# Patient Record
Sex: Male | Born: 1990 | Race: Black or African American | Hispanic: No | Marital: Single | Smoking: Current some day smoker
Health system: Southern US, Community
[De-identification: ages and names within clinical notes are randomized; demographics above are authoritative.]

## PROBLEM LIST (undated history)

## (undated) DIAGNOSIS — B009 Herpesviral infection, unspecified: Secondary | ICD-10-CM

## (undated) HISTORY — PX: KNEE SURGERY: SHX244

---
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
## 2014-05-01 ENCOUNTER — Emergency Department (INDEPENDENT_AMBULATORY_CARE_PROVIDER_SITE_OTHER)
Admission: EM | Admit: 2014-05-01 | Discharge: 2014-05-01 | Disposition: A | Discharge status: Home / Self Care | Payer: Self-pay | Source: Home / Self Care | Attending: Emergency Medicine | Admitting: Emergency Medicine

## 2014-05-01 ENCOUNTER — Encounter (HOSPITAL_COMMUNITY): Payer: Self-pay | Admitting: Emergency Medicine

## 2014-05-01 DIAGNOSIS — H9203 Otalgia, bilateral: Secondary | ICD-10-CM

## 2014-05-01 MED ORDER — FLUTICASONE PROPIONATE 50 MCG/ACT NA SUSP: 1.0000 | Freq: Every day | NASAL | Status: AC

## 2014-05-01 MED ORDER — IPRATROPIUM BROMIDE 0.06 % NA SOLN: 2.0000 | Freq: Four times a day (QID) | NASAL | Status: AC

## 2014-05-01 MED ORDER — CETIRIZINE HCL 10 MG PO TABS: 10.0000 mg | ORAL_TABLET | Freq: Every day | ORAL | Status: AC

## 2014-05-01 NOTE — Discharge Instructions (Signed)
Your ear pain is likely from sinus congestion. Take Zyrtec 1 pill daily. Use the Flonase once daily. Use Atrovent nasal spray 4 times a day. You should see improvement in the next week. If your ears are still bothering you in 1 week, follow-up with the ENT physician.

## 2014-05-01 NOTE — ED Notes (Signed)
Pt states that he has had ear pain and stuffy ears for 2 weeks.

## 2014-05-01 NOTE — ED Provider Notes (Signed)
CSN: [id number]     Arrival date & time 05/01/14  1834 History   First MD Initiated Contact with Patient 05/01/14 1937     Chief Complaint  Patient presents with  . Otalgia   (Consider location/radiation/quality/duration/timing/severity/associated sxs/prior Treatment) HPI  He is a 24 year old man here for evaluation of bilateral ear pain. He states this is been going on for 2 weeks. It is in the setting of a cold with nasal congestion. He denies any drainage from his ears. No fevers or chills. He does report feeling that his ears are stuffy, and decreased hearing.  History reviewed. No pertinent past medical history. History reviewed. No pertinent past surgical history. Family History  Problem Relation Age of Onset  . Hypertension Father   . Diabetes Father    History  Substance Use Topics  . Smoking status: Never Smoker   . Smokeless tobacco: Not on file  . Alcohol Use: No    Review of Systems  Constitutional: Negative for fever.  HENT: Positive for congestion, ear pain and hearing loss. Negative for ear discharge.     Allergies  Cherry  Home Medications   Prior to Admission medications   Medication Sig Start Date End Date Taking? Authorizing Provider  cetirizine (ZYRTEC) 10 MG tablet Take 1 tablet (10 mg total) by mouth daily. 05/01/14   Charm RingsErin J Asiya Cutbirth, MD  fluticasone (FLONASE) 50 MCG/ACT nasal spray Place 1 spray into both nostrils daily. 05/01/14   Charm RingsErin J Sheletha Bow, MD  ipratropium (ATROVENT) 0.06 % nasal spray Place 2 sprays into both nostrils 4 (four) times daily. 05/01/14   Charm RingsErin J Krystian Younglove, MD   BP 140/95 mmHg  Pulse 72  Temp(Src) 97 F (36.1 C) (Oral)  Resp 16  SpO2 98% Physical Exam  Constitutional: He is oriented to person, place, and time. He appears well-developed and well-nourished. No distress.  HENT:  Head: Atraumatic.  Right Ear: Tympanic membrane and external ear normal.  Left Ear: Tympanic membrane is retracted.  Cardiovascular: Normal rate.   Pulmonary/Chest:  Effort normal.  Neurological: He is alert and oriented to person, place, and time.    ED Course  Procedures (including critical care time) Labs Review Labs Reviewed - No data to display  Imaging Review No results found.   MDM   1. Ear pain, bilateral    This is likely secondary to nasal congestion and retracted ears. We'll treat with Zyrtec, Flonase, Atrovent nasal spray. If no improvement in 1 week, he should follow-up with ENT.    Charm RingsErin J Karalynn Cottone, MD 05/01/14 2016

## 2014-11-19 ENCOUNTER — Emergency Department (HOSPITAL_COMMUNITY)
Admission: EM | Admit: 2014-11-19 | Discharge: 2014-11-20 | Disposition: A | Discharge status: Home / Self Care | Payer: Medicaid Other | Attending: Emergency Medicine | Admitting: Emergency Medicine

## 2014-11-19 ENCOUNTER — Encounter (HOSPITAL_COMMUNITY): Payer: Self-pay | Admitting: Emergency Medicine

## 2014-11-19 ENCOUNTER — Emergency Department (HOSPITAL_COMMUNITY): Payer: Medicaid Other

## 2014-11-19 DIAGNOSIS — R059 Cough, unspecified: Secondary | ICD-10-CM

## 2014-11-19 DIAGNOSIS — R05 Cough: Secondary | ICD-10-CM

## 2014-11-19 DIAGNOSIS — Z79899 Other long term (current) drug therapy: Secondary | ICD-10-CM | POA: Insufficient documentation

## 2014-11-19 DIAGNOSIS — J020 Streptococcal pharyngitis: Secondary | ICD-10-CM | POA: Insufficient documentation

## 2014-11-19 LAB — RAPID STREP SCREEN (MED CTR MEBANE ONLY): STREPTOCOCCUS, GROUP A SCREEN (DIRECT): POSITIVE — AB

## 2014-11-19 NOTE — ED Notes (Signed)
Pt states he's been having problems with a sore throat for the past two weeks. States he's been managing symptoms with Dayquil/Nyquil. States today he noticed some blood in his sputum. Bloody tonsils/uvula upon inspection in triage. Pt states it's painful to swallow, and he feels as though his breathing has become more shallow over the past day. No obvious distress in triage, O2 sat 98% on RA

## 2014-11-19 NOTE — ED Provider Notes (Signed)
CSN: [id number]     Arrival date & time 11/19/14  2255 History   First MD Initiated Contact with Patient 11/19/14 2306     Chief Complaint  Patient presents with  . Sore Throat  . Dysphagia     (Consider location/radiation/quality/duration/timing/severity/associated sxs/prior Treatment) HPI Comments: Patient presents today with complaints of sore throat and cough.  He states that he had a sore throat 2 weeks ago, which improved after a couple of days.  Sore throat returned two days ago.  He states that the pain is gradually worsening.  He has also reports that he has had a productive cough for the past couple of weeks.  He states that today he noticed small specks of blood when he coughed.  He has been taking Nyquil and Dayquil for his symptoms without relief.  He reports recent exposure to Strep throat.  He denies difficulty swallowing, but states increased pain with swallowing.  He denies fever, chills, SOB, chest pain, nausea, or vomiting.  No history of PE and DVT.  The history is provided by the patient.    History reviewed. No pertinent past medical history. Past Surgical History  Procedure Laterality Date  . Knee surgery Bilateral    Family History  Problem Relation Age of Onset  . Hypertension Father   . Diabetes Father    Social History  Substance Use Topics  . Smoking status: Never Smoker   . Smokeless tobacco: None  . Alcohol Use: No    Review of Systems  All other systems reviewed and are negative.     Allergies  Cherry  Home Medications   Prior to Admission medications   Medication Sig Start Date End Date Taking? Authorizing Provider  Pseudoeph-Doxylamine-DM-APAP (NYQUIL MULTI-SYMPTOM PO) Take 1 capsule by mouth 2 (two) times daily.   Yes Historical Provider, MD  cetirizine (ZYRTEC) 10 MG tablet Take 1 tablet (10 mg total) by mouth daily. Patient not taking: Reported on 11/19/2014 05/01/14   Charm Rings, MD  fluticasone [facility]) 50 MCG/ACT nasal spray  Place 1 spray into both nostrils daily. Patient not taking: Reported on 11/19/2014 05/01/14   Charm Rings, MD  ipratropium (ATROVENT) 0.06 % nasal spray Place 2 sprays into both nostrils 4 (four) times daily. Patient not taking: Reported on 11/19/2014 05/01/14   Charm Rings, MD   BP 146/85 mmHg  Pulse 90  Temp(Src) 98.2 F (36.8 C) (Oral)  Resp 18  Ht  (1.727 m)  Wt 280 lb (127.007 kg)  BMI 42.58 kg/m2  SpO2 99% Physical Exam  Constitutional: He appears well-developed and well-nourished.  HENT:  Head: Normocephalic and atraumatic.  Mouth/Throat: Uvula is midline. No trismus in the jaw. No uvula swelling. Posterior oropharyngeal edema and posterior oropharyngeal erythema present. No oropharyngeal exudate or tonsillar abscesses.  Normal voice phonation Patient handling secretions well Small amount of blood visualized on the right tonsil  Neck: Normal range of motion. Neck supple.  Cardiovascular: Normal rate, regular rhythm and normal heart sounds.   Pulmonary/Chest: Effort normal and breath sounds normal.  Neurological: He is alert.  Skin: Skin is warm and dry.  Psychiatric: He has a normal mood and affect.  Nursing note and vitals reviewed.   ED Course  Procedures (including critical care time) Labs Review Labs Reviewed  RAPID STREP SCREEN (NOT AT [facility])    Imaging Review Dg Chest 2 View  11/20/2014   CLINICAL DATA:  Cough for 2 days.  Hemoptysis today.  Sore throat.  EXAM: CHEST  2 VIEW  COMPARISON:  03/16/2011  FINDINGS: The cardiomediastinal contours are normal. The lungs are clear. Pulmonary vasculature is normal. No consolidation, pleural effusion, or pneumothorax. Mild elevation of right hemidiaphragm. No acute osseous abnormalities are seen.  IMPRESSION: No acute pulmonary process.   Electronically Signed   By: Rubye Oaks M.D.   On: 11/20/2014 00:15   I have personally reviewed and evaluated these images and lab results as part of my medical decision-making.    EKG Interpretation None      MDM   Final diagnoses:  Cough   Patient presents today with cough and ST.  CXR is negative.  Rapid strep is positive.  Patient given Rx for Bicillin IM.  No signs of Epiglottitis, PTA, or Retropharyngeal Abscess at this time.  Patient is stable for discharge.  Return precautions given.      Santiago Glad, PA-C 11/21/14 2220  Derwood Kaplan, MD 11/23/14 [phone]

## 2014-11-20 MED ORDER — LIDOCAINE VISCOUS 2 % MT SOLN: 20.0000 mL | OROMUCOSAL | Status: AC | PRN

## 2014-11-20 MED ORDER — IBUPROFEN 800 MG PO TABS: 800.0000 mg | ORAL_TABLET | Freq: Three times a day (TID) | ORAL | Status: DC

## 2014-11-20 MED ORDER — PENICILLIN G BENZATHINE 1200000 UNIT/2ML IM SUSP
1.2000 10*6.[IU] | Freq: Once | INTRAMUSCULAR | Status: AC
  Administered 2014-11-20: 1.2 10*6.[IU] via INTRAMUSCULAR
  Filled 2014-11-20: qty 2

## 2015-07-20 ENCOUNTER — Encounter (HOSPITAL_COMMUNITY): Payer: Self-pay | Admitting: Emergency Medicine

## 2015-07-20 ENCOUNTER — Emergency Department (HOSPITAL_COMMUNITY)
Admission: EM | Admit: 2015-07-20 | Discharge: 2015-07-20 | Disposition: A | Discharge status: Home / Self Care | Payer: Medicaid Other | Attending: Emergency Medicine | Admitting: Emergency Medicine

## 2015-07-20 DIAGNOSIS — J45909 Unspecified asthma, uncomplicated: Secondary | ICD-10-CM | POA: Insufficient documentation

## 2015-07-20 DIAGNOSIS — F17200 Nicotine dependence, unspecified, uncomplicated: Secondary | ICD-10-CM | POA: Insufficient documentation

## 2015-07-20 DIAGNOSIS — N481 Balanitis: Secondary | ICD-10-CM | POA: Insufficient documentation

## 2015-07-20 DIAGNOSIS — I10 Essential (primary) hypertension: Secondary | ICD-10-CM | POA: Insufficient documentation

## 2015-07-20 DIAGNOSIS — E669 Obesity, unspecified: Secondary | ICD-10-CM | POA: Insufficient documentation

## 2015-07-20 DIAGNOSIS — E119 Type 2 diabetes mellitus without complications: Secondary | ICD-10-CM | POA: Insufficient documentation

## 2015-07-20 LAB — CBG MONITORING, ED: Glucose-Capillary: 111 mg/dL — ABNORMAL HIGH (ref 65–99)

## 2015-07-20 NOTE — ED Notes (Signed)
Pt states he was not circumcised and is having irritation to his penis  Pt states there is a lot of moisture and when he washes skin comes off on the washcloth and there is redness where the skin has come off   Pt states it is very sensitive to touch

## 2015-07-20 NOTE — ED Provider Notes (Signed)
CSN: [id number]     Arrival date & time 07/20/15  [id number] History   First MD Initiated Contact with Patient 07/20/15 [phone]     Chief Complaint  Patient presents with  . irritation to penis      (Consider location/radiation/quality/duration/timing/severity/associated sxs/prior Treatment) The history is provided by the patient.  25 year old male with a history of hypertension, diabetes, asthma comes in with irritation of the glans penis for the last several days. He relates that he is uncircumcised and some moisture tends to get between the pubis and the glands. He states that it is very irritated but he denies actual pain. He also states that it tends to itch. He has tried to treated by just manually drying it. He does express some interest in circumcision.  History reviewed. No pertinent past medical history. Past Surgical History  Procedure Laterality Date  . Knee surgery Bilateral    Family History  Problem Relation Age of Onset  . Hypertension Father   . Diabetes Father    Social History  Substance Use Topics  . Smoking status: Current Some Day Smoker  . Smokeless tobacco: None  . Alcohol Use: No    Review of Systems  All other systems reviewed and are negative.     Allergies  Cherry  Home Medications   Prior to Admission medications   Medication Sig Start Date End Date Taking? Authorizing Provider  cetirizine (ZYRTEC) 10 MG tablet Take 1 tablet (10 mg total) by mouth daily. Patient not taking: Reported on 11/19/2014 05/01/14   Charm RingsErin J Honig, MD  fluticasone [facility](FLONASE) 50 MCG/ACT nasal spray Place 1 spray into both nostrils daily. Patient not taking: Reported on 11/19/2014 05/01/14   Charm RingsErin J Honig, MD  ipratropium (ATROVENT) 0.06 % nasal spray Place 2 sprays into both nostrils 4 (four) times daily. Patient not taking: Reported on 11/19/2014 05/01/14   Charm RingsErin J Honig, MD  lidocaine (XYLOCAINE) 2 % solution Use as directed 20 mLs in the mouth or throat as needed for mouth  pain. Patient not taking: Reported on 07/20/2015 11/20/14   Santiago GladHeather Laisure, PA-C   BP 143/88 mmHg  Pulse 75  Temp(Src) 98 F (36.7 C) (Oral)  Resp 18  Ht 5\' 8"  (1.727 m)  Wt 284 lb (128.822 kg)  BMI 43.19 kg/m2  SpO2 98% Physical Exam  Nursing note and vitals reviewed.  Obese 25 year old male, resting comfortably and in no acute distress. Vital signs are significant for hypertension. Oxygen saturation is 98%, which is normal. Head is normocephalic and atraumatic. PERRLA, EOMI. Oropharynx is clear. Neck is nontender and supple without adenopathy or JVD. Back is nontender and there is no CVA tenderness. Lungs are clear without rales, wheezes, or rhonchi. Chest is nontender. Heart has regular rate and rhythm without murmur. Abdomen is soft, flat, nontender without masses or hepatosplenomegaly and peristalsis is normoactive. Genitalia: Uncircumcised penis. Mild erythema of the glans penis with some shallow ulcerations. Testes descended without masses. No inguinal adenopathy. Extremities have no cyanosis or edema, full range of motion is present. Skin is warm and dry without rash. Neurologic: Mental status is normal, cranial nerves are intact, there are no motor or sensory deficits.  ED Course  Procedures (including critical care time) Labs Review Results for orders placed or performed during the hospital encounter of 07/20/15  POC CBG, ED  Result Value Ref Range   Glucose-Capillary 111 (H) 65 - 99 mg/dL   I have personally reviewed and evaluated these lab results as part  of my medical decision-making.   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 He relates a history of type 2 diabetes but is not on any medication. Will check CBG. Poorly controlled diabetes would put him at increased risk for monilial infections. Old records are reviewed and he has no relevant past visits.  Blood sugar has come back at 111, so he is not started on  hypoglycemic medication.  Dione Booze, MD 07/20/15 [phone]

## 2015-07-20 NOTE — ED Notes (Signed)
MD at bedside. 

## 2015-07-20 NOTE — Discharge Instructions (Signed)
Apply an antifungal cream like lotrimin or monistat - apply twice a day. Store brands are as effective as name brands.   Balanitis Balanitis is inflammation of the head of the penis (glans).  CAUSES  Balanitis has multiple causes, both infectious and noninfectious. Often balanitis is the result of poor personal hygiene, especially in uncircumcised males. Without adequate washing, viruses, bacteria, and yeast collect between the foreskin and the glans. This can cause an infection. Lack of air and irritation from a normal secretion called smegma contribute to the cause in uncircumcised males. Other causes include:  Chemical irritation from the use of certain soaps and shower gels (especially soaps with perfumes), condoms, personal lubricants, petroleum jelly, spermicides, and fabric conditioners.  Skin conditions, such as eczema, dermatitis, and psoriasis.  Allergies to drugs, such as tetracycline and sulfa.  Certain medical conditions, including liver cirrhosis, congestive heart failure, and kidney disease.  Morbid obesity. RISK FACTORS  Diabetes mellitus.  A tight foreskin that is difficult to pull back past the glans (phimosis).  Sex without the use of a condom. SIGNS AND SYMPTOMS  Symptoms may include:  Discharge coming from under the foreskin.  Tenderness.  Itching and inability to get an erection (because of the pain).  Redness and a rash.  Sores on the glans and on the foreskin. DIAGNOSIS Diagnosis of balanitis is confirmed through a physical exam. TREATMENT The treatment is based on the cause of the balanitis. Treatment may include:  Frequent cleansing.  Keeping the glans and foreskin dry.  Use of medicines such as creams, pain medicines, antibiotics, or medicines to treat fungal infections.  Sitz baths. If the irritation has caused a scar on the foreskin that prevents easy retraction, a circumcision may be recommended.  HOME CARE INSTRUCTIONS  Sex should be  avoided until the condition has cleared. MAKE SURE YOU:  Understand these instructions.  Will watch your condition.  Will get help right away if you are not doing well or get worse.   This information is not intended to replace advice given to you by your health care provider. Make sure you discuss any questions you have with your health care provider.   Document Released: 08/03/2008 Document Revised: 03/22/2013 Document Reviewed: 09/06/2012 Elsevier Interactive Patient Education [employer]2016 [employer]

## 2015-08-30 ENCOUNTER — Encounter (HOSPITAL_COMMUNITY): Payer: Self-pay | Admitting: Emergency Medicine

## 2015-08-30 ENCOUNTER — Emergency Department (HOSPITAL_COMMUNITY): Payer: Self-pay

## 2015-08-30 ENCOUNTER — Emergency Department (HOSPITAL_COMMUNITY)
Admission: EM | Admit: 2015-08-30 | Discharge: 2015-08-31 | Disposition: A | Discharge status: Home / Self Care | Payer: Self-pay | Attending: Emergency Medicine | Admitting: Emergency Medicine

## 2015-08-30 DIAGNOSIS — R0789 Other chest pain: Secondary | ICD-10-CM | POA: Insufficient documentation

## 2015-08-30 DIAGNOSIS — J069 Acute upper respiratory infection, unspecified: Secondary | ICD-10-CM | POA: Insufficient documentation

## 2015-08-30 DIAGNOSIS — B9789 Other viral agents as the cause of diseases classified elsewhere: Secondary | ICD-10-CM

## 2015-08-30 DIAGNOSIS — J988 Other specified respiratory disorders: Secondary | ICD-10-CM

## 2015-08-30 DIAGNOSIS — J40 Bronchitis, not specified as acute or chronic: Secondary | ICD-10-CM | POA: Insufficient documentation

## 2015-08-30 LAB — URINALYSIS, ROUTINE W REFLEX MICROSCOPIC
GLUCOSE, UA: NEGATIVE mg/dL
KETONES UR: 15 mg/dL — AB
LEUKOCYTES UA: NEGATIVE
Nitrite: NEGATIVE
PROTEIN: 100 mg/dL — AB
Specific Gravity, Urine: 1.038 — ABNORMAL HIGH (ref 1.005–1.030)
pH: 6 (ref 5.0–8.0)

## 2015-08-30 LAB — CBC WITH DIFFERENTIAL/PLATELET
BASOS ABS: 0 10*3/uL (ref 0.0–0.1)
BASOS PCT: 0 %
EOS PCT: 0 %
Eosinophils Absolute: 0 10*3/uL (ref 0.0–0.7)
HCT: 39.3 % (ref 39.0–52.0)
Hemoglobin: 13.4 g/dL (ref 13.0–17.0)
LYMPHS PCT: 18 %
Lymphs Abs: 2.1 10*3/uL (ref 0.7–4.0)
MCH: 27.9 pg (ref 26.0–34.0)
MCHC: 34.1 g/dL (ref 30.0–36.0)
MCV: 81.7 fL (ref 78.0–100.0)
MONO ABS: 1.9 10*3/uL — AB (ref 0.1–1.0)
Monocytes Relative: 17 %
Neutro Abs: 7.3 10*3/uL (ref 1.7–7.7)
Neutrophils Relative %: 65 %
PLATELETS: 315 10*3/uL (ref 150–400)
RBC: 4.81 MIL/uL (ref 4.22–5.81)
RDW: 12.9 % (ref 11.5–15.5)
WBC: 11.3 10*3/uL — ABNORMAL HIGH (ref 4.0–10.5)

## 2015-08-30 LAB — BASIC METABOLIC PANEL
ANION GAP: 11 (ref 5–15)
BUN: 10 mg/dL (ref 6–20)
CALCIUM: 8.8 mg/dL — AB (ref 8.9–10.3)
CO2: 21 mmol/L — ABNORMAL LOW (ref 22–32)
CREATININE: 1.06 mg/dL (ref 0.61–1.24)
Chloride: 105 mmol/L (ref 101–111)
GFR calc Af Amer: >60 mL/min (ref >60)
GLUCOSE: 102 mg/dL — AB (ref 65–99)
Potassium: 3.3 mmol/L — ABNORMAL LOW (ref 3.5–5.1)
Sodium: 137 mmol/L (ref 135–145)

## 2015-08-30 LAB — URINE MICROSCOPIC-ADD ON

## 2015-08-30 LAB — RAPID STREP SCREEN (MED CTR MEBANE ONLY): STREPTOCOCCUS, GROUP A SCREEN (DIRECT): NEGATIVE

## 2015-08-30 MED ORDER — ACETAMINOPHEN 325 MG PO TABS
650.0000 mg | ORAL_TABLET | Freq: Once | ORAL | Status: AC | PRN
  Administered 2015-08-30: 650 mg via ORAL
  Filled 2015-08-30: qty 2

## 2015-08-30 MED ORDER — KETOROLAC TROMETHAMINE 30 MG/ML IJ SOLN
30.0000 mg | Freq: Once | INTRAMUSCULAR | Status: AC
  Administered 2015-08-30: 30 mg via INTRAVENOUS
  Filled 2015-08-30: qty 1

## 2015-08-30 MED ORDER — SODIUM CHLORIDE 0.9 % IV BOLUS (SEPSIS)
1000.0000 mL | Freq: Once | INTRAVENOUS | Status: AC
  Administered 2015-08-30: 1000 mL via INTRAVENOUS

## 2015-08-30 NOTE — ED Notes (Addendum)
Pt states that he has been having sore throat, nasal congestion, chest pain without cough for 3 days.  Pt is febrile in triage.  Also endorses diarrhea.  Pt states "I would say that I have diabetes...its not type 2 yet but it's pretty close.  I came in here one time and was diagnosed with blantitis, which is when you got creamy stuff on your penis which is caused by not being circumsized and diabetes".

## 2015-08-30 NOTE — ED Provider Notes (Signed)
CSN: [id number]     Arrival date & time 08/30/15  1837 History   First MD Initiated Contact with Patient 08/30/15 2058     Chief Complaint  Patient presents with  . Chest Pain  . Sore Throat     (Consider location/radiation/quality/duration/timing/severity/associated sxs/prior Treatment) HPI  25 year old male, otherwise healthy who presents with sore throat and headache. He reports 3 days of headache, palpitations, chest discomfort, sore throat, post-nasal drainage. Associated decreased appetite. No sob. Mild non-productive cough. Chest discomfort described as pressure associated with chest congestions. With diarrhea. No dysuria or urinary frequency.  No abdominal pain, but occasional cramping. No recent antibiotics. No recent traveling. Work with customers but unknown sick contacts. Feels fatigued.   History reviewed. No pertinent past medical history. Past Surgical History  Procedure Laterality Date  . Knee surgery Bilateral    Family History  Problem Relation Age of Onset  . Hypertension Father   . Diabetes Father    Social History  Substance Use Topics  . Smoking status: Current Some Day Smoker  . Smokeless tobacco: None  . Alcohol Use: No    Review of Systems 10/14 systems reviewed and are negative other than those stated in the HPI    Allergies  Cherry  Home Medications   Prior to Admission medications   Medication Sig Start Date End Date Taking? Authorizing Provider  ibuprofen (ADVIL,MOTRIN) 200 MG tablet Take 800 mg by mouth every 6 (six) hours as needed for moderate pain.   Yes Historical Provider, MD  Pseudoeph-Doxylamine-DM-APAP (NYQUIL PO) Take 30 mLs by mouth daily as needed (cold symptoms).   Yes Historical Provider, MD  cetirizine (ZYRTEC) 10 MG tablet Take 1 tablet (10 mg total) by mouth daily. Patient not taking: Reported on 11/19/2014 05/01/14   Charm RingsErin J Honig, MD  fluticasone [facility](FLONASE) 50 MCG/ACT nasal spray Place 1 spray into both nostrils daily. Patient  not taking: Reported on 11/19/2014 05/01/14   Charm RingsErin J Honig, MD  ipratropium (ATROVENT) 0.06 % nasal spray Place 2 sprays into both nostrils 4 (four) times daily. Patient not taking: Reported on 11/19/2014 05/01/14   Charm RingsErin J Honig, MD  lidocaine (XYLOCAINE) 2 % solution Use as directed 20 mLs in the mouth or throat as needed for mouth pain. Patient not taking: Reported on 07/20/2015 11/20/14   Heather Laisure, PA-C   BP 132/95 mmHg  Pulse 99  Temp(Src) 101.6 F (38.7 C) (Oral)  Resp 20  SpO2 97% Physical Exam Physical Exam  Nursing note and vitals reviewed. Constitutional: Well developed, well nourished, non-toxic, and in no acute distress Head: Normocephalic and atraumatic.  Mouth/Throat: Oropharynx is erythematous posteriorly without exudates and moist.  Neck: Normal range of motion. Neck supple. No meningismus.  Cardiovascular: Tachycardic rate and regular rhythm.  No LE edema. Pulmonary/Chest: Effort normal and breath sounds normal.  Abdominal: Soft. There is no tenderness. There is no rebound and no guarding.  Musculoskeletal: Normal range of motion.  Neurological: Alert, no facial droop, fluent speech, moves all extremities symmetrically Skin: Skin is warm and dry.  Psychiatric: Cooperative  ED Course  Procedures (including critical care time) Labs Review Labs Reviewed  CBC WITH DIFFERENTIAL/PLATELET - Abnormal; Notable for the following:    WBC 11.3 (*)    Monocytes Absolute 1.9 (*)    All other components within normal limits  BASIC METABOLIC PANEL - Abnormal; Notable for the following:    Potassium 3.3 (*)    CO2 21 (*)    Glucose, Bld 102 (*)  Calcium 8.8 (*)    All other components within normal limits  URINALYSIS, ROUTINE W REFLEX MICROSCOPIC (NOT AT [facility]) - Abnormal; Notable for the following:    Color, Urine AMBER (*)    Specific Gravity, Urine 1.038 (*)    Hgb urine dipstick MODERATE (*)    Bilirubin Urine SMALL (*)    Ketones, ur 15 (*)    Protein, ur 100 (*)     All other components within normal limits  URINE MICROSCOPIC-ADD ON - Abnormal; Notable for the following:    Squamous Epithelial / LPF 0-5 (*)    Bacteria, UA MANY (*)    All other components within normal limits  RAPID STREP SCREEN (NOT AT [facility])  CULTURE, GROUP A STREP [facility])    Imaging Review Dg Chest 2 View  08/30/2015  CLINICAL DATA:  Sore throat and headache for 3 days. Chest discomfort with upper respiratory tract symptoms. EXAM: CHEST  2 VIEW COMPARISON:  11/19/2014 FINDINGS: Unchanged elevation of right hemidiaphragm. Central bronchial thickening. The cardiomediastinal contours are normal. Pulmonary vasculature is normal. No consolidation, pleural effusion, or pneumothorax. No acute osseous abnormalities are seen. IMPRESSION: Central bronchial thickening may reflect acute bronchitis or asthma. No pneumonia. Electronically Signed   By: Rubye Oaks M.D.   On: 08/30/2015 23:03   I have personally reviewed and evaluated these images and lab results as part of my medical decision-making.   EKG Interpretation   Date/Time:  Thursday August 30 2015 19:09:50 EDT Ventricular Rate:  110 PR Interval:  150 QRS Duration: 77 QT Interval:  297 QTC Calculation: 402 R Axis:   44 Text Interpretation:  Sinus tachycardia Borderline repolarization  abnormality Baseline wander in lead(s) V1 No acute changes Nonspecific  twave abnormalities Confirmed by Miia Blanks MD, Annabelle Harman ([id number]) on 08/30/2015 9:29:16  PM      MDM   Final diagnoses:  Bronchitis  Viral respiratory infection    25 year old male who presents with sore throat, cough, congestion and headache for 3 days. Febrile, tachycardic, but normotensive and in respiratory distress. Lungs coarse. Overall presentations c/w likely viral process. No meningismus, no concern for meningitis. Blood work overall non-concerning. UA negative. CXR without infiltrate but likely bronchitis. Given IVF, tylenol and toradol. Received albuterol. Feels improved.  Tachcyardia resolved, with HR 70-90 on cardiac monitor on my re-evaluation. Discussed continued supportive care. Strict return and follow-up instructions reviewed. He expressed understanding of all discharge instructions and felt comfortable with the plan of care.     Lavera Guise, MD 08/31/15 [phone]

## 2015-08-31 MED ORDER — AEROCHAMBER PLUS FLO-VU LARGE MISC
1.0000 | Freq: Once | Status: AC
  Administered 2015-08-31: 1
  Filled 2015-08-31 (×2): qty 1

## 2015-08-31 MED ORDER — ALBUTEROL SULFATE HFA 108 (90 BASE) MCG/ACT IN AERS
2.0000 | INHALATION_SPRAY | Freq: Once | RESPIRATORY_TRACT | Status: AC
  Administered 2015-08-31: 2 via RESPIRATORY_TRACT
  Filled 2015-08-31: qty 6.7

## 2015-08-31 NOTE — Discharge Instructions (Signed)
Use inhaler as needed for cough, sob or chest tightness. Take motrin and tylenol as needed for pain control and fever. Drink plenty of fluids and get plenty of rest. Return for worsening symptoms, including difficulty breathing, worsening pain, confusion, or any other symptoms concerning to you.  Upper Respiratory Infection, Adult Most upper respiratory infections (URIs) are caused by a virus. A URI affects the nose, throat, and upper air passages. The most common type of URI is often called "the common cold." HOME CARE   Take medicines only as told by your doctor.  Gargle warm saltwater or take cough drops to comfort your throat as told by your doctor.  Use a warm mist humidifier or inhale steam from a shower to increase air moisture. This may make it easier to breathe.  Drink enough fluid to keep your pee (urine) clear or pale yellow.  Eat soups and other clear broths.  Have a healthy diet.  Rest as needed.  Go back to work when your fever is gone or your doctor says it is okay.  You may need to stay home longer to avoid giving your URI to others.  You can also wear a face mask and wash your hands often to prevent spread of the virus.  Use your inhaler more if you have asthma.  Do not use any tobacco products, including cigarettes, chewing tobacco, or electronic cigarettes. If you need help quitting, ask your doctor. GET HELP IF:  You are getting worse, not better.  Your symptoms are not helped by medicine.  You have chills.  You are getting more short of breath.  You have brown or red mucus.  You have yellow or brown discharge from your nose.  You have pain in your face, especially when you bend forward.  You have a fever.  You have puffy (swollen) neck glands.  You have pain while swallowing.  You have white areas in the back of your throat. GET HELP RIGHT AWAY IF:   You have very bad or constant:  Headache.  Ear pain.  Pain in your forehead, behind  your eyes, and over your cheekbones (sinus pain).  Chest pain.  You have long-lasting (chronic) lung disease and any of the following:  Wheezing.  Long-lasting cough.  Coughing up blood.  A change in your usual mucus.  You have a stiff neck.  You have changes in your:  Vision.  Hearing.  Thinking.  Mood. MAKE SURE YOU:   Understand these instructions.  Will watch your condition.  Will get help right away if you are not doing well or get worse.   This information is not intended to replace advice given to you by your health care provider. Make sure you discuss any questions you have with your health care provider.   Document Released: 09/03/2007 Document Revised: 08/01/2014 Document Reviewed: 06/22/2013 Elsevier Interactive Patient Education [employer]2016 [employer]vier Inc.

## 2015-09-02 LAB — CULTURE, GROUP A STREP (THRC)

## 2016-01-23 DIAGNOSIS — L0201 Cutaneous abscess of face: Secondary | ICD-10-CM | POA: Insufficient documentation

## 2016-01-23 DIAGNOSIS — F17200 Nicotine dependence, unspecified, uncomplicated: Secondary | ICD-10-CM | POA: Insufficient documentation

## 2016-01-23 DIAGNOSIS — Z79899 Other long term (current) drug therapy: Secondary | ICD-10-CM | POA: Insufficient documentation

## 2016-01-24 ENCOUNTER — Encounter (HOSPITAL_COMMUNITY): Payer: Self-pay | Admitting: Emergency Medicine

## 2016-01-24 ENCOUNTER — Emergency Department (HOSPITAL_COMMUNITY)
Admission: EM | Admit: 2016-01-24 | Discharge: 2016-01-24 | Disposition: A | Discharge status: Home / Self Care | Payer: Medicaid Other | Attending: Emergency Medicine | Admitting: Emergency Medicine

## 2016-01-24 DIAGNOSIS — L0291 Cutaneous abscess, unspecified: Secondary | ICD-10-CM

## 2016-01-24 HISTORY — DX: Herpesviral infection, unspecified: B00.9

## 2016-01-24 MED ORDER — CEPHALEXIN 500 MG PO CAPS
500.0000 mg | ORAL_CAPSULE | Freq: Once | ORAL | Status: AC
  Administered 2016-01-24: 500 mg via ORAL
  Filled 2016-01-24: qty 1

## 2016-01-24 MED ORDER — CEPHALEXIN 500 MG PO CAPS: 500.0000 mg | ORAL_CAPSULE | Freq: Four times a day (QID) | ORAL | 0 refills | Status: AC

## 2016-01-24 MED ORDER — DOXYCYCLINE HYCLATE 100 MG PO TABS
100.0000 mg | ORAL_TABLET | Freq: Once | ORAL | Status: AC
  Administered 2016-01-24: 100 mg via ORAL
  Filled 2016-01-24: qty 1

## 2016-01-24 MED ORDER — DOXYCYCLINE HYCLATE 100 MG PO CAPS: 100.0000 mg | ORAL_CAPSULE | Freq: Two times a day (BID) | ORAL | 0 refills | Status: AC

## 2016-01-24 NOTE — ED Triage Notes (Signed)
Pt reports a sore area on upper right cheek at beard line that began swelling 2 days ago. No symptoms of itching or pain

## 2016-01-24 NOTE — ED Provider Notes (Signed)
[facility]-EMERGENCY DEPT Provider Note   CSN: [id number] Arrival date & time: 01/23/16  2346     History   Chief Complaint Chief Complaint  Patient presents with  . Abscess    HPI Phillip Underwood is a 25 y.o. male with no sig PMH, here with abscess to the R face. It is along his sideburns and has been there for several days.  He denies having this in the past.  He has a h/o HSV and is concerned that it is an outbreak. He denies fevers.  There are no further complaints.  10 Systems reviewed and are negative for acute change except as noted in the HPI.   HPI  Past Medical History:  Diagnosis Date  . HSV-2 infection     There are no active problems to display for this patient.   Past Surgical History:  Procedure Laterality Date  . KNEE SURGERY Bilateral        Home Medications    Prior to Admission medications   Medication Sig Start Date End Date Taking? Authorizing Provider  cephALEXin (KEFLEX) 500 MG capsule Take 1 capsule (500 mg total) by mouth 4 (four) times daily. 01/24/16   Tomasita CrumbleAdeleke Aerie Donica, MD  cetirizine (ZYRTEC) 10 MG tablet Take 1 tablet (10 mg total) by mouth daily. Patient not taking: Reported on 11/19/2014 05/01/14   Charm RingsErin J Honig, MD  doxycycline (VIBRAMYCIN) 100 MG capsule Take 1 capsule (100 mg total) by mouth 2 (two) times daily. One po bid x 7 days 01/24/16   Tomasita CrumbleAdeleke Jessicalynn Deshong, MD  fluticasone (FLONASE) 50 MCG/ACT nasal spray Place 1 spray into both nostrils daily. Patient not taking: Reported on 11/19/2014 05/01/14   Charm RingsErin J Honig, MD  ibuprofen (ADVIL,MOTRIN) 200 MG tablet Take 800 mg by mouth every 6 (six) hours as needed for moderate pain.    Historical Provider, MD  ipratropium (ATROVENT) 0.06 % nasal spray Place 2 sprays into both nostrils 4 (four) times daily. Patient not taking: Reported on 11/19/2014 05/01/14   Charm RingsErin J Honig, MD  lidocaine (XYLOCAINE) 2 % solution Use as directed 20 mLs in the mouth or throat as needed for mouth pain. Patient not taking: Reported on  07/20/2015 11/20/14   Santiago GladHeather Laisure, PA-C  Pseudoeph-Doxylamine-DM-APAP (NYQUIL PO) Take 30 mLs by mouth daily as needed (cold symptoms).    Historical Provider, MD    Family History Family History  Problem Relation Age of Onset  . Hypertension Father   . Diabetes Father     Social History Social History  Substance Use Topics  . Smoking status: Current Some Day Smoker  . Smokeless tobacco: Never Used  . Alcohol use Yes     Comment: occasionally     Allergies   Cherry   Review of Systems Review of Systems   Physical Exam Updated Vital Signs BP 137/96 (BP Location: Left Arm)   Pulse 86   Temp 97.6 F (36.4 C) (Oral)   Resp 20   Ht 5\' 8"  (1.727 m)   Wt 270 lb (122.5 kg)   SpO2 98%   BMI 41.05 kg/m   Physical Exam  Constitutional: He is oriented to person, place, and time. Vital signs are normal. He appears well-developed and well-nourished.  Non-toxic appearance. He does not appear ill. No distress.  HENT:  Head: Normocephalic and atraumatic.  Nose: Nose normal.  Mouth/Throat: Oropharynx is clear and moist. No oropharyngeal exudate.  Eyes: Conjunctivae and EOM are normal. Pupils are equal, round, and reactive to light. No  scleral icterus.  Neck: Normal range of motion. Neck supple. No tracheal deviation, no edema, no erythema and normal range of motion present. No thyroid mass and no thyromegaly present.  Cardiovascular: Normal rate, regular rhythm, S1 normal, S2 normal, normal heart sounds, intact distal pulses and normal pulses.  Exam reveals no gallop and no friction rub.   No murmur heard. Pulmonary/Chest: Effort normal and breath sounds normal. No respiratory distress. He has no wheezes. He has no rhonchi. He has no rales.  Abdominal: Soft. Normal appearance and bowel sounds are normal. He exhibits no distension, no ascites and no mass. There is no hepatosplenomegaly. There is no tenderness. There is no rebound, no guarding and no CVA tenderness.    Musculoskeletal: Normal range of motion. He exhibits no edema or tenderness.  Lymphadenopathy:    He has no cervical adenopathy.  Neurological: He is alert and oriented to person, place, and time. He has normal strength. No cranial nerve deficit or sensory deficit.  Skin: Skin is warm, dry and intact. No petechiae and no rash noted. He is not diaphoretic. No erythema. No pallor.  2cm area of induration to the R face, this area comes to a head.  Nursing note and vitals reviewed.    ED Treatments / Results  Labs (all labs ordered are listed, but only abnormal results are displayed) Labs Reviewed - No data to display  EKG  EKG Interpretation None       Radiology No results found.  Procedures Procedures (including critical care time)  Medications Ordered in ED Medications  cephALEXin (KEFLEX) capsule 500 mg (500 mg Oral Given 01/24/16 0436)  doxycycline (VIBRA-TABS) tablet 100 mg (100 mg Oral Given 01/24/16 0436)     Initial Impression / Assessment and Plan / ED Course  I have reviewed the triage vital signs and the nursing notes.  Pertinent labs & imaging results that were available during my care of the patient were reviewed by me and considered in my medical decision making (see chart for details).  Clinical Course   Patient presents to the ED for abscess.  This was opened. Will treat with keflex and doxy x 3 days.  PCP fu advised for wound check.  He appears well and in NAD.  VS remain within his normal limits and he is safe for DC.   INCISION AND DRAINAGE Performed by: Tomasita Crumble Consent: Verbal consent obtained. Risks and benefits: risks, benefits and alternatives were discussed Type: abscess  Body area: R face   Complexity: complex Blunt dissection to break up loculations  Drainage: purulent  Drainage amount: 3cc  Patient tolerance: Patient tolerated the procedure well with no immediate complications.     Final Clinical Impressions(s) / ED  Diagnoses   Final diagnoses:  Abscess    New Prescriptions New Prescriptions   CEPHALEXIN (KEFLEX) 500 MG CAPSULE    Take 1 capsule (500 mg total) by mouth 4 (four) times daily.   DOXYCYCLINE (VIBRAMYCIN) 100 MG CAPSULE    Take 1 capsule (100 mg total) by mouth 2 (two) times daily. One po bid x 7 days     Tomasita Crumble, MD 01/24/16 [phone]

## 2017-03-30 DIAGNOSIS — N481 Balanitis: Secondary | ICD-10-CM | POA: Insufficient documentation

## 2017-03-30 DIAGNOSIS — F17200 Nicotine dependence, unspecified, uncomplicated: Secondary | ICD-10-CM | POA: Insufficient documentation

## 2017-03-30 DIAGNOSIS — Z79899 Other long term (current) drug therapy: Secondary | ICD-10-CM | POA: Insufficient documentation

## 2017-03-31 ENCOUNTER — Emergency Department (HOSPITAL_COMMUNITY)
Admission: EM | Admit: 2017-03-31 | Discharge: 2017-03-31 | Disposition: A | Discharge status: Home / Self Care | Payer: Self-pay | Attending: Emergency Medicine | Admitting: Emergency Medicine

## 2017-03-31 ENCOUNTER — Encounter (HOSPITAL_COMMUNITY): Payer: Self-pay

## 2017-03-31 DIAGNOSIS — N481 Balanitis: Secondary | ICD-10-CM

## 2017-03-31 NOTE — ED Provider Notes (Signed)
[facility]-EMERGENCY DEPT Provider Note   CSN: [id number] Arrival date & time: 03/30/17  2357     History   Chief Complaint Chief Complaint  Patient presents with  . SEXUALLY TRANSMITTED DISEASE    HPI Phillip Underwood is a 27 y.o. male.  HPI Reports irritation around his penis. Pt is uncircumcised. 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 Reports irritation for 24 hours. No fevers. No new sexual contacts. No penile discharge   Past Medical History:  Diagnosis Date  . HSV-2 infection     There are no active problems to display for this patient.   Past Surgical History:  Procedure Laterality Date  . KNEE SURGERY Bilateral        Home Medications    Prior to Admission medications   Medication Sig Start Date End Date Taking? Authorizing Provider  cephALEXin (KEFLEX) 500 MG capsule Take 1 capsule (500 mg total) by mouth 4 (four) times daily. 01/24/16   Tomasita Crumbleni, Adeleke, MD  cetirizine (ZYRTEC) 10 MG tablet Take 1 tablet (10 mg total) by mouth daily. Patient not taking: Reported on 11/19/2014 05/01/14   Charm RingsHonig, Erin J, MD  doxycycline (VIBRAMYCIN) 100 MG capsule Take 1 capsule (100 mg total) by mouth 2 (two) times daily. One po bid x 7 days 01/24/16   Tomasita Crumbleni, Adeleke, MD  fluticasone (FLONASE) 50 MCG/ACT nasal spray Place 1 spray into both nostrils daily. Patient not taking: Reported on 11/19/2014 05/01/14   Charm RingsHonig, Erin J, MD  ibuprofen (ADVIL,MOTRIN) 200 MG tablet Take 800 mg by mouth every 6 (six) hours as needed for moderate pain.    [provider]  ipratropium (ATROVENT) 0.06 % nasal spray Place 2 sprays into both nostrils 4 (four) times daily. Patient not taking: Reported on 11/19/2014 05/01/14   Charm RingsHonig, Erin J, MD  lidocaine (XYLOCAINE) 2 % solution Use as directed 20 mLs in the mouth or throat as needed for mouth pain. Patient not taking: Reported on 07/20/2015 11/20/14   Santiago GladLaisure, Heather, PA-C  Pseudoeph-Doxylamine-DM-APAP (NYQUIL PO) Take 30 mLs by  mouth daily as needed (cold symptoms).    [provider]    Family History Family History  Problem Relation Age of Onset  . Hypertension Father   . Diabetes Father     Social History Social History   Tobacco Use  . Smoking status: Current Some Day Smoker  . Smokeless tobacco: Never Used  Substance Use Topics  . Alcohol use: Yes    Comment: occasionally  . Drug use: No     Allergies   Cherry   Review of Systems Review of Systems  All other systems reviewed and are negative.    Physical Exam Updated Vital Signs BP (!) 152/87 (BP Location: Right Arm)   Pulse 81   Resp 18   SpO2 98%   Physical Exam  Constitutional: He is oriented to person, place, and time. He appears well-developed and well-nourished.  HENT:  Head: Normocephalic.  Eyes: EOM are normal.  Neck: Normal range of motion.  Pulmonary/Chest: Effort normal.  Abdominal: He exhibits no distension.  Genitourinary:  Genitourinary Comments: No penile herpetic lesions noted. Shaft normal. Mild skin irritation of glans penis without signs of cellultiis  Musculoskeletal: Normal range of motion.  Neurological: He is alert and oriented to person, place, and time.  Psychiatric: He has a normal mood and affect.  Nursing note and vitals reviewed.    ED Treatments / Results  Labs (all labs ordered are listed,  but only abnormal results are displayed) Labs Reviewed - No data to display  EKG  EKG Interpretation None       Radiology No results found.  Procedures Procedures (including critical care time)  Medications Ordered in ED Medications - No data to display   Initial Impression / Assessment and Plan / ED Course  I have reviewed the triage vital signs and the nursing notes.  Pertinent labs & imaging results that were available during my care of the patient were reviewed by me and considered in my medical decision making (see chart for details).     Urology follow up. Well  appearing. Recommended drying the skin and applying a topical abx. Pt request urology referral for possible circumcision  Final Clinical Impressions(s) / ED Diagnoses   Final diagnoses:  Balanitis    ED Discharge Orders    None       Azalia Bilis, MD 03/31/17 [phone]

## 2017-03-31 NOTE — ED Triage Notes (Signed)
Pt complains of blisters on his penis that he noticed this am, he says they are moist but no discharge

## 2017-03-31 NOTE — Discharge Instructions (Signed)
Apply neosporin twice a day

## 2017-04-15 IMAGING — CR DG CHEST 2V
2 series · 2 of 2 positions shown · non-contrast
Comparison: 11/19/2014

CLINICAL DATA: Sore throat and headache for 3 days. Chest
discomfort with upper respiratory tract symptoms.

EXAM:
CHEST  2 VIEW

[w chest pa]
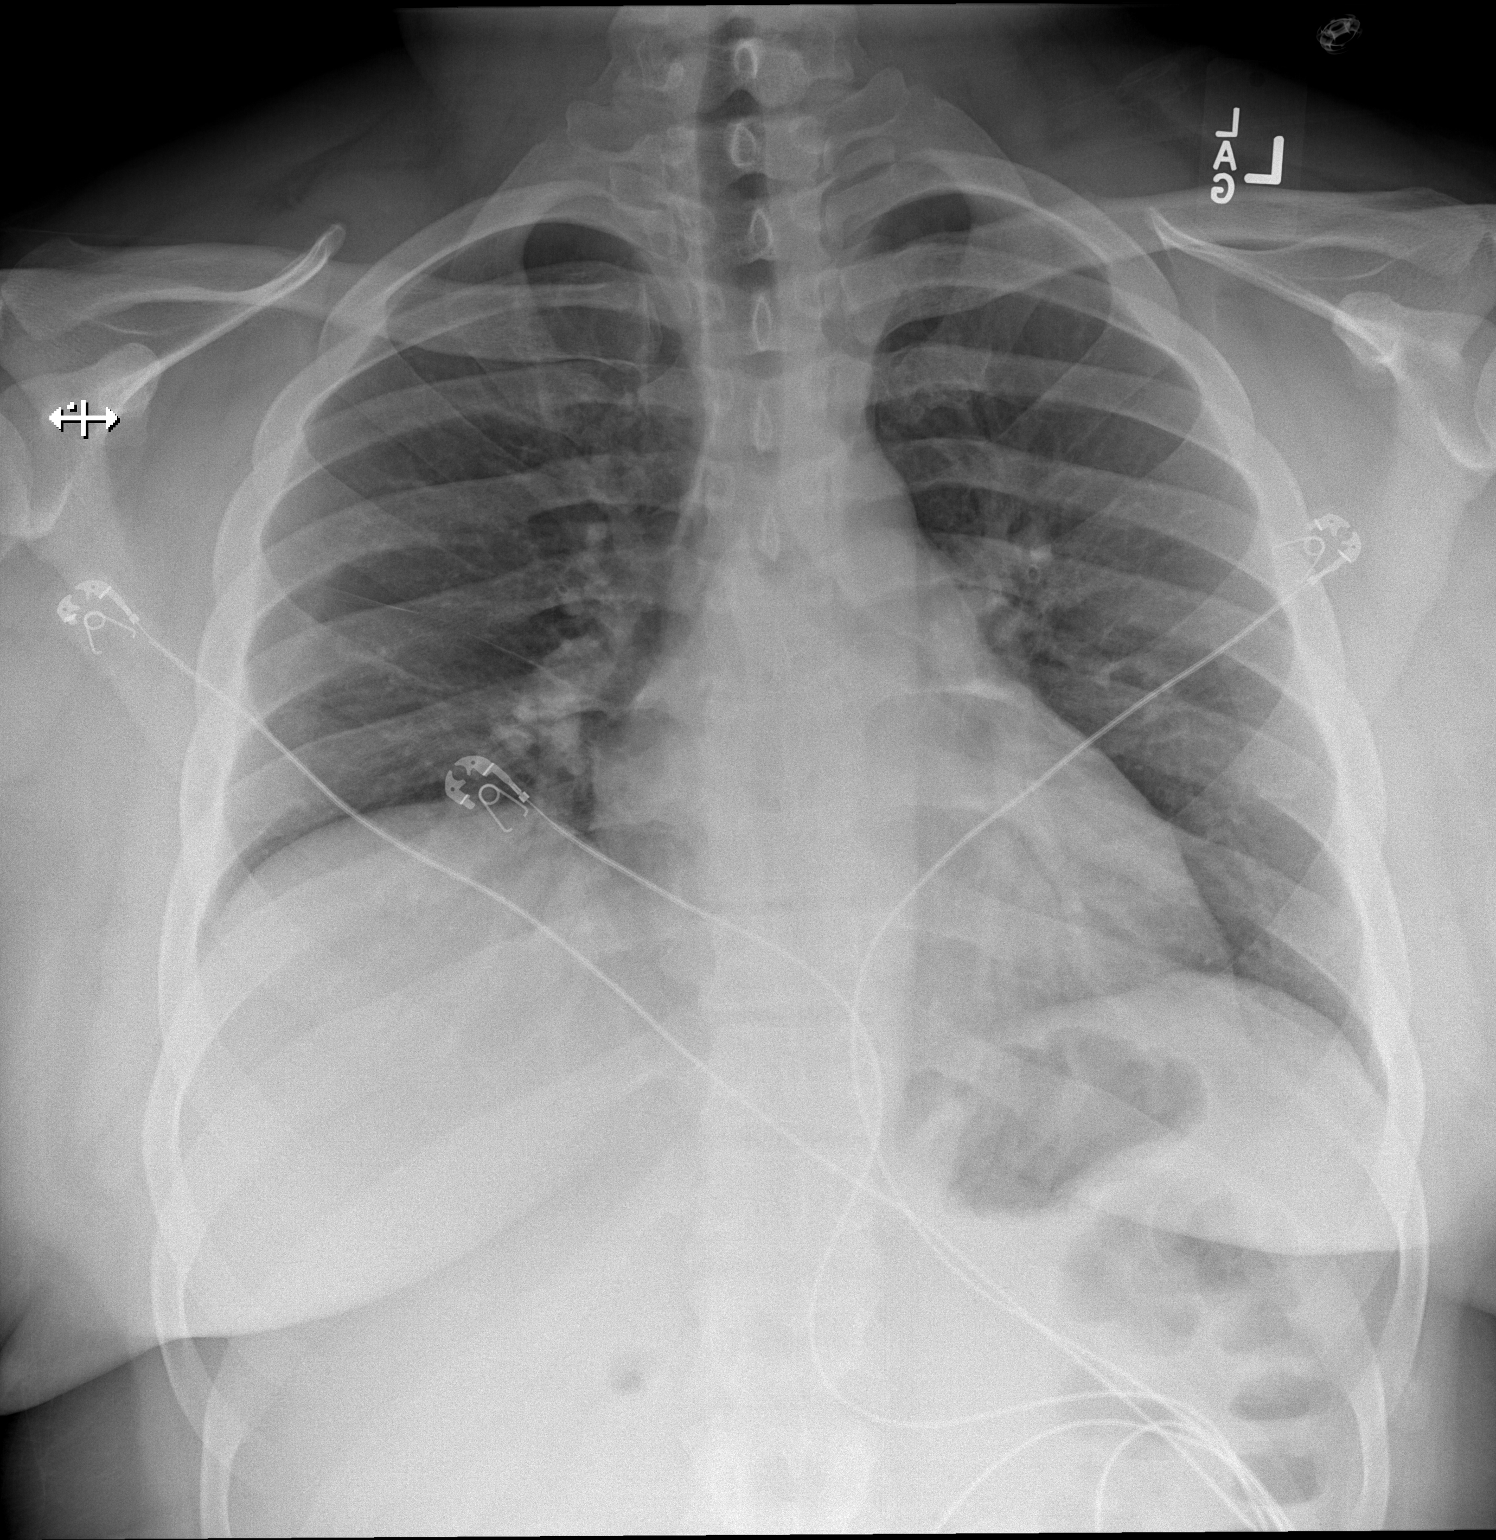

[w chest lat]
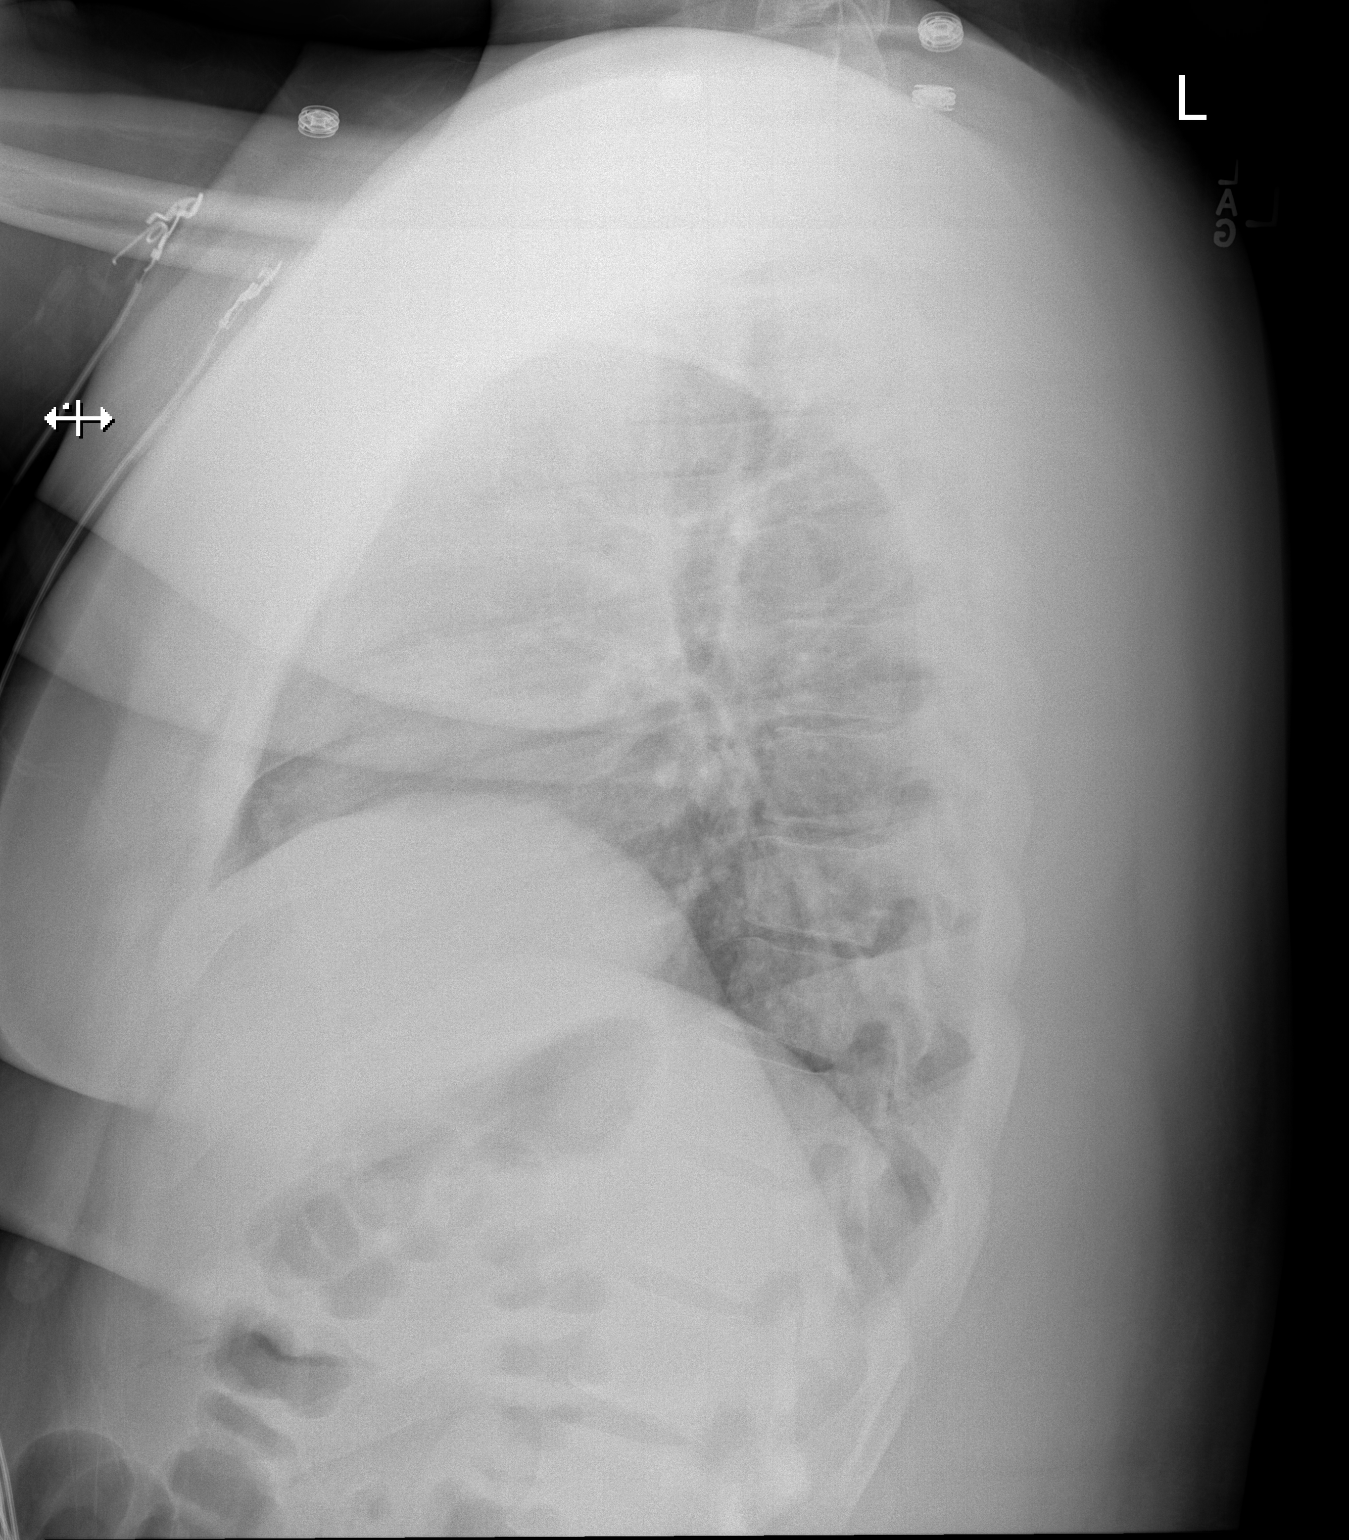

[2 of 2 positions shown; findings below may reference images not displayed]

FINDINGS: Unchanged elevation of right hemidiaphragm. Central bronchial
thickening. The cardiomediastinal contours are normal. Pulmonary
vasculature is normal. No consolidation, pleural effusion, or
pneumothorax. No acute osseous abnormalities are seen.
IMPRESSION: Central bronchial thickening may reflect acute bronchitis or asthma.
No pneumonia.

## 2019-07-04 ENCOUNTER — Encounter (HOSPITAL_COMMUNITY): Payer: Self-pay

## 2019-07-04 ENCOUNTER — Other Ambulatory Visit: Payer: Self-pay

## 2019-07-04 DIAGNOSIS — F17200 Nicotine dependence, unspecified, uncomplicated: Secondary | ICD-10-CM | POA: Insufficient documentation

## 2019-07-04 DIAGNOSIS — R112 Nausea with vomiting, unspecified: Secondary | ICD-10-CM | POA: Insufficient documentation

## 2019-07-04 DIAGNOSIS — R109 Unspecified abdominal pain: Secondary | ICD-10-CM | POA: Diagnosis not present

## 2019-07-04 NOTE — ED Triage Notes (Signed)
Pt took 30-40 mg of THC with food around 4p and is experiencing nausea and generalized discomfort. Endorses anxiety.

## 2019-07-05 ENCOUNTER — Emergency Department (HOSPITAL_COMMUNITY)
Admission: EM | Admit: 2019-07-05 | Discharge: 2019-07-05 | Disposition: A | Discharge status: Home / Self Care | Payer: BC Managed Care – PPO | Attending: Emergency Medicine | Admitting: Emergency Medicine

## 2019-07-05 DIAGNOSIS — R112 Nausea with vomiting, unspecified: Secondary | ICD-10-CM

## 2019-07-05 DIAGNOSIS — R109 Unspecified abdominal pain: Secondary | ICD-10-CM

## 2019-07-05 NOTE — ED Provider Notes (Addendum)
Phillip Underwood  CSN: [id number] Arrival date & time: 07/04/19 2118  Chief Complaint(s) Ingestion (Delta 8, THC)  HPI Phillip Underwood is a 29 y.o. male who presents to the emergency department for several hours of abdominal discomfort and emesis.  Patient reports that he he took delta 8 [facility]) around this time.  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  1 hour after ingesting patient began having a symptoms.  Since being here his symptoms have resolved.  He no longer has the abdominal discomfort or emesis.  He denies any known sick contacts.  No other suspicious food intake.  No recent fevers or infections.  No chest pain or shortness of breath.  No other physical complaints.  Reports that the amount of delta 8 that he took is his usual amount.  HPI  Past Medical History Past Medical History:  Diagnosis Date  . HSV-2 infection    There are no problems to display for this patient.  Home Medication(s) Prior to Admission medications   Medication Sig Start Date End Date Taking? Authorizing Provider  cephALEXin (KEFLEX) 500 MG capsule Take 1 capsule (500 mg total) by mouth 4 (four) times daily. 01/24/16   Tomasita Crumble, MD  cetirizine (ZYRTEC) 10 MG tablet Take 1 tablet (10 mg total) by mouth daily. Patient not taking: Reported on 11/19/2014 05/01/14   Charm Rings, MD  doxycycline (VIBRAMYCIN) 100 MG capsule Take 1 capsule (100 mg total) by mouth 2 (two) times daily. One po bid x 7 days 01/24/16   Tomasita Crumble, MD  fluticasone (FLONASE) 50 MCG/ACT nasal spray Place 1 spray into both nostrils daily. Patient not taking: Reported on 11/19/2014 05/01/14   Charm Rings, MD  ibuprofen (ADVIL,MOTRIN) 200 MG tablet Take 800 mg by mouth every 6 (six) hours as needed for moderate pain.    [provider]  ipratropium (ATROVENT) 0.06 % nasal spray Place 2 sprays into both nostrils 4 (four) times daily. Patient not taking: Reported on 11/19/2014  05/01/14   Charm Rings, MD  lidocaine (XYLOCAINE) 2 % solution Use as directed 20 mLs in the mouth or throat as needed for mouth pain. Patient not taking: Reported on 07/20/2015 11/20/14   Santiago Glad, PA-C  Pseudoeph-Doxylamine-DM-APAP (NYQUIL PO) Take 30 mLs by mouth daily as needed (cold symptoms).    [provider]                                                                                                                                    Past Surgical History Past Surgical History:  Procedure Laterality Date  . KNEE SURGERY Bilateral    Family History Family History  Problem Relation Age of Onset  . Hypertension Father   . Diabetes Father     Social History Social History   Tobacco Use  . Smoking status: Current Some Day Smoker  .  Smokeless tobacco: Never Used  Substance Use Topics  . Alcohol use: Yes    Comment: occasionally  . Drug use: No   Allergies Cherry  Review of Systems Review of Systems All other systems are reviewed and are negative for acute change except as noted in the HPI  Physical Exam Vital Signs  I have reviewed the triage vital signs BP (!) 161/109 (BP Location: Right Arm)   Pulse 92   Temp 98.2 F (36.8 C) (Oral)   Resp (!) 24   Ht 5\' 9"  (1.753 m)   Wt (!) 154.2 kg   SpO2 94%   BMI 50.21 kg/m   Physical Exam Vitals reviewed.  Constitutional:      General: He is not in acute distress.    Appearance: He is well-developed. He is not diaphoretic.  HENT:     Head: Normocephalic and atraumatic.     Jaw: No trismus.     Right Ear: External ear normal.     Left Ear: External ear normal.     Nose: Nose normal.  Eyes:     General: No scleral icterus.    Conjunctiva/sclera: Conjunctivae normal.  Neck:     Trachea: Phonation normal.  Cardiovascular:     Rate and Rhythm: Normal rate and regular rhythm.  Pulmonary:     Effort: Pulmonary effort is normal. No respiratory distress.     Breath sounds: No stridor.    Abdominal:     General: There is no distension.     Tenderness: There is no abdominal tenderness.  Musculoskeletal:        General: Normal range of motion.     Cervical back: Normal range of motion.  Neurological:     Mental Status: He is alert and oriented to person, place, and time.  Psychiatric:        Behavior: Behavior normal.     ED Results and Treatments Labs (all labs ordered are listed, but only abnormal results are displayed) Labs Reviewed - No data to display                                                                                                                       EKG  EKG Interpretation  Date/Time:    Ventricular Rate:    PR Interval:    QRS Duration:   QT Interval:    QTC Calculation:   R Axis:     Text Interpretation:        Radiology No results found.  Pertinent labs & imaging results that were available during my care of the patient were reviewed by me and considered in my medical decision making (see chart for details).  Medications Ordered in ED Medications - No data to display  Procedures Procedures  (including critical care time)  Medical Decision Making / ED Course I have reviewed the nursing notes for this encounter and the patient's prior records (if available in EHR or on provided paperwork).   Phillip Underwood was evaluated in Emergency Department on 07/05/2019 for the symptoms described in the history of present illness. He was evaluated in the context of the global COVID-19 pandemic, which necessitated consideration that the patient might be at risk for infection with the SARS-CoV-2 virus that causes COVID-19. Institutional protocols and algorithms that pertain to the evaluation of patients at risk for COVID-19 are in a state of rapid change based on information released by regulatory bodies including the  CDC and federal and state organizations. These policies and algorithms were followed during the patient's care in the ED.  Patient presents with abdominal discomfort with nausea and nonbloody nonbilious emesis following ingestion of delta 8 (THC).  Possible suspicious food intake concerning for food poisoning.  Abdomen is currently benign.  Vital stable.  Patient able to tolerate oral intake.  He is well-appearing well-hydrated nontoxic.  Do not feel that labs are necessary at this time.  I have low suspicion for serious intra-abdominal Fama to assess infectious process or bowel obstruction requiring imaging.      Final Clinical Impression(s) / ED Diagnoses Final diagnoses:  Abdominal discomfort  Non-intractable vomiting with nausea, unspecified vomiting type   The patient appears reasonably screened and/or stabilized for discharge and I doubt any other medical condition or other [facility] requiring further screening, evaluation, or treatment in the ED at this time prior to discharge. Safe for discharge with strict return precautions.  Disposition: Discharge  Condition: Good  I have discussed the results, Dx and Tx plan with the patient/family who expressed understanding and agree(s) with the plan. Discharge instructions discussed at length. The patient/family was given strict return precautions who verbalized understanding of the instructions. No further questions at time of discharge.    ED Discharge Orders    None      Follow Up: Primary care provider  Call  As needed      This chart was dictated using voice recognition software.  Despite best efforts to proofread,  errors can occur which can change the documentation meaning.   Nira Conn, MD 07/05/19 0151    Nira Conn, MD 07/19/19 [phone]
